# Patient Record
Sex: Female | Born: 1990 | Hispanic: Yes | Marital: Married | Smoking: Never smoker
Health system: Southern US, Community
[De-identification: ages and names within clinical notes are randomized; demographics above are authoritative.]

## PROBLEM LIST (undated history)

## (undated) DIAGNOSIS — O009 Unspecified ectopic pregnancy without intrauterine pregnancy: Secondary | ICD-10-CM

## (undated) DIAGNOSIS — K819 Cholecystitis, unspecified: Secondary | ICD-10-CM

## (undated) DIAGNOSIS — Z789 Other specified health status: Secondary | ICD-10-CM

## (undated) HISTORY — DX: Unspecified ectopic pregnancy without intrauterine pregnancy: O00.90

## (undated) HISTORY — DX: Cholecystitis, unspecified: K81.9

## (undated) HISTORY — PX: CHOLECYSTECTOMY: SHX55

## (undated) HISTORY — PX: SALPINGECTOMY: SHX328

---

## 2018-02-24 DIAGNOSIS — Z8759 Personal history of other complications of pregnancy, childbirth and the puerperium: Secondary | ICD-10-CM | POA: Insufficient documentation

## 2019-06-22 ENCOUNTER — Inpatient Hospital Stay (HOSPITAL_COMMUNITY): Payer: Self-pay

## 2019-06-22 ENCOUNTER — Inpatient Hospital Stay (HOSPITAL_COMMUNITY)
Admission: AD | Admit: 2019-06-22 | Discharge: 2019-06-22 | Disposition: A | Discharge status: Home / Self Care | Payer: Self-pay | Attending: Obstetrics and Gynecology | Admitting: Obstetrics and Gynecology

## 2019-06-22 ENCOUNTER — Other Ambulatory Visit: Payer: Self-pay

## 2019-06-22 ENCOUNTER — Encounter (HOSPITAL_COMMUNITY): Payer: Self-pay | Admitting: Obstetrics and Gynecology

## 2019-06-22 DIAGNOSIS — O3680X0 Pregnancy with inconclusive fetal viability, not applicable or unspecified: Secondary | ICD-10-CM | POA: Insufficient documentation

## 2019-06-22 DIAGNOSIS — O209 Hemorrhage in early pregnancy, unspecified: Secondary | ICD-10-CM | POA: Insufficient documentation

## 2019-06-22 DIAGNOSIS — Z3A09 9 weeks gestation of pregnancy: Secondary | ICD-10-CM | POA: Insufficient documentation

## 2019-06-22 DIAGNOSIS — R109 Unspecified abdominal pain: Secondary | ICD-10-CM | POA: Insufficient documentation

## 2019-06-22 DIAGNOSIS — O26899 Other specified pregnancy related conditions, unspecified trimester: Secondary | ICD-10-CM

## 2019-06-22 DIAGNOSIS — O26891 Other specified pregnancy related conditions, first trimester: Secondary | ICD-10-CM | POA: Insufficient documentation

## 2019-06-22 DIAGNOSIS — O4691 Antepartum hemorrhage, unspecified, first trimester: Secondary | ICD-10-CM

## 2019-06-22 LAB — URINALYSIS, ROUTINE W REFLEX MICROSCOPIC
Bilirubin Urine: NEGATIVE
Glucose, UA: NEGATIVE mg/dL
Ketones, ur: NEGATIVE mg/dL
Nitrite: NEGATIVE
Protein, ur: 100 mg/dL — AB
RBC / HPF: >50 RBC/hpf — ABNORMAL HIGH (ref 0–5)
Specific Gravity, Urine: 1.027 (ref 1.005–1.030)
pH: 5 (ref 5.0–8.0)

## 2019-06-22 LAB — CBC
HCT: 40.5 % (ref 36.0–46.0)
Hemoglobin: 12.8 g/dL (ref 12.0–15.0)
MCH: 28.8 pg (ref 26.0–34.0)
MCHC: 31.6 g/dL (ref 30.0–36.0)
MCV: 91.2 fL (ref 80.0–100.0)
Platelets: 305 10*3/uL (ref 150–400)
RBC: 4.44 MIL/uL (ref 3.87–5.11)
RDW: 13.1 % (ref 11.5–15.5)
WBC: 10.3 10*3/uL (ref 4.0–10.5)
nRBC: 0 % (ref 0.0–0.2)

## 2019-06-22 LAB — WET PREP, GENITAL
Sperm: NONE SEEN
Trich, Wet Prep: NONE SEEN
Yeast Wet Prep HPF POC: NONE SEEN

## 2019-06-22 LAB — ABO/RH: ABO/RH(D): O POS

## 2019-06-22 LAB — POCT PREGNANCY, URINE: Preg Test, Ur: POSITIVE — AB

## 2019-06-22 LAB — HCG, QUANTITATIVE, PREGNANCY: hCG, Beta Chain, Quant, S: 17451 m[IU]/mL — ABNORMAL HIGH (ref <5)

## 2019-06-22 LAB — HIV ANTIBODY (ROUTINE TESTING W REFLEX): HIV Screen 4th Generation wRfx: NONREACTIVE

## 2019-06-22 NOTE — MAU Provider Note (Signed)
History     CSN: [id number]  Arrival date and time: 06/22/19 0347   First Provider Initiated Contact with Patient 06/22/19 [place] # [id number] [place]     Chief Complaint  Patient presents with  . Vaginal Bleeding  . Abdominal Pain   [name] is a 29 y.o. year old G5P1021 female at [redacted]w[redacted]d weeks gestation who presents to MAU reporting positive pregnancy test, vaginal bleeding that started Sunday, Jun 17, 2018 and became heavier on Wednesday, 10/21/2019.  She is reporting spotting with clots and wearing a small pad.  She also complains of lower abdominal pain that "it is very strongly goes away".  She rates it a 5 out of 10. She denies N/V/D.   OB History    Gravida  4   Para  1   Term  1   Preterm      AB  2   Living  1     SAB      TAB  1   Ectopic  1   Multiple      Live Births  1           History reviewed. No pertinent past medical history.  History reviewed. No pertinent surgical history.  History reviewed. No pertinent family history.  Social History   Tobacco Use  . Smoking status: Not on file  Substance Use Topics  . Alcohol use: Not on file  . Drug use: Not on file    Allergies: No Known Allergies  No medications prior to admission.    Review of Systems  Constitutional: Negative.   HENT: Negative.   Eyes: Negative.   Respiratory: Negative.   Cardiovascular: Negative.   Gastrointestinal: Negative.   Endocrine: Negative.   Genitourinary: Positive for pelvic pain (lower abd, "hits me strong & goes away") and vaginal bleeding (heavier since 5/26; "spotting with clots").  Musculoskeletal: Negative.   Skin: Negative.   Allergic/Immunologic: Negative.   Neurological: Negative.   Hematological: Negative.   Psychiatric/Behavioral: Negative.    Physical Exam   Blood pressure (!) 147/79, pulse 89, temperature 98.4 F (36.9 C), temperature source Oral, resp. rate 18, height 5' 2.6" (1.59 m), last menstrual  period 04/15/2019, SpO2 100 %.  Physical Exam  Nursing note and vitals reviewed. Constitutional: She is oriented to person, place, and time. She appears well-developed and well-nourished.  HENT:  Head: Normocephalic and atraumatic.  Eyes: Pupils are equal, round, and reactive to light.  Cardiovascular: Normal rate.  Respiratory: Effort normal.  GI: Soft.  Genitourinary:    Genitourinary Comments: Uterus: non-tender, SE: cervix is smooth, pink, no lesions, small amt of dark, red bloody d/c -- WP, GC/CT done, closed/long/firm, no CMT or friability, no adnexal tenderness    Musculoskeletal:        General: Normal range of motion.     Cervical back: Normal range of motion.  Neurological: She is alert and oriented to person, place, and time.  Skin: Skin is warm and dry.  Psychiatric: She has a normal mood and affect. Her behavior is normal. Judgment and thought content normal.    MAU Course  Procedures  MDM CCUA UPT CBC ABO/Rh HCG Wet Prep GC/CT -- pending HIV -- pending OB < 14 wks [place] with TV  Results for orders placed or performed during the hospital encounter of 06/22/19 (from the past 24 hour(s))  Pregnancy, urine POC     Status: Abnormal   Collection Time: 06/22/19  9:35 AM  Result Value Ref Range   Preg Test, Ur POSITIVE (A) NEGATIVE  Urinalysis, Routine w reflex microscopic     Status: Abnormal   Collection Time: 06/22/19  9:38 AM  Result Value Ref Range   Color, Urine YELLOW YELLOW   APPearance CLOUDY (A) CLEAR   Specific Gravity, Urine 1.027 1.005 - 1.030   pH 5.0 5.0 - 8.0   Glucose, UA NEGATIVE NEGATIVE mg/dL   Hgb urine dipstick LARGE (A) NEGATIVE   Bilirubin Urine NEGATIVE NEGATIVE   Ketones, ur NEGATIVE NEGATIVE mg/dL   Protein, ur 539 (A) NEGATIVE mg/dL   Nitrite NEGATIVE NEGATIVE   Leukocytes,Ua SMALL (A) NEGATIVE   RBC / HPF >50 (H) 0 - 5 RBC/hpf   WBC, UA 21-50 0 - 5 WBC/hpf   Bacteria, UA FEW (A) NONE SEEN   Squamous Epithelial / LPF 6-10 0 - 5    Mucus PRESENT    Ca Oxalate Crys, UA PRESENT   HIV Antibody (routine testing w rflx)     Status: None   Collection Time: 06/22/19 10:52 AM  Result Value Ref Range   HIV Screen 4th Generation wRfx Non Reactive Non Reactive  CBC     Status: None   Collection Time: 06/22/19 10:52 AM  Result Value Ref Range   WBC 10.3 4.0 - 10.5 K/uL   RBC 4.44 3.87 - 5.11 MIL/uL   Hemoglobin 12.8 12.0 - 15.0 g/dL   HCT 76.7 34.1 - 93.7 %   MCV 91.2 80.0 - 100.0 fL   MCH 28.8 26.0 - 34.0 pg   MCHC 31.6 30.0 - 36.0 g/dL   RDW 90.2 40.9 - 73.5 %   Platelets 305 150 - 400 K/uL   nRBC 0.0 0.0 - 0.2 %  ABO/Rh     Status: None   Collection Time: 06/22/19 10:52 AM  Result Value Ref Range   ABO/RH(D) O POS    No rh immune globuloin      NOT A RH IMMUNE GLOBULIN CANDIDATE, PT RH POSITIVE Performed at [facility], [address], [place], [place] [address]   hCG, quantitative, pregnancy     Status: Abnormal   Collection Time: 06/22/19 10:52 AM  Result Value Ref Range   hCG, Beta Chain, Quant, S 17,451 (H) <5 mIU/mL  Wet prep, genital     Status: Abnormal   Collection Time: 06/22/19 11:49 AM   Specimen: Cervix  Result Value Ref Range   Yeast Wet Prep HPF POC NONE SEEN NONE SEEN   Trich, Wet Prep NONE SEEN NONE SEEN   Clue Cells Wet Prep HPF POC PRESENT (A) NONE SEEN   WBC, Wet Prep HPF POC MODERATE (A) NONE SEEN   Sperm NONE SEEN     US OB LESS THAN 14 WEEKS WITH OB TRANSVAGINAL  Result Date: 06/22/2019 CLINICAL DATA:  Vaginal bleeding, abdominal pain EXAM: OBSTETRIC <14 WK ULTRASOUND TECHNIQUE: Transabdominal and transvaginal ultrasound was performed for evaluation of the gestation as well as the maternal uterus and adnexal regions. COMPARISON:  None. FINDINGS: Intrauterine gestational sac: Single Yolk sac:  Equivocal. Embryo:  Equivocal fetal pole measuring no greater than 2 mm. Cardiac Activity: Not Visualized. Heart Rate: Not visualized. MSD:  12.2 mm   6 w   0 d Subchorionic hemorrhage:   None visualized. Maternal uterus/adnexae: Uterus is unremarkable. Adnexa are not well visualized. Not well visualized. IMPRESSION: Single intrauterine gestation with equivocal fetal pole measuring no greater than 2 mm. Sonographic gestational age of [redacted] weeks, 0  days. Early pregnancy of uncertain viability. Recommend follow-up ultrasound in 7 days and serial beta HCG to confirm continued development and viability. Electronically Signed   By: Lauralyn Primes M.D.   On: 06/22/2019 12:53     Assessment and Plan  1) Bleeding in early pregnancy  - Information provided on bleeding in pregnancy   2) Pregnancy with uncertain fetal viability, single or unspecified fetus  - US OB Comp Less 14 Wks in 2 wks - Information provided on miscarriage   3) Abdominal pain affecting pregnancy, antepartum  - Information provided on abd pain in pregnancy  - Discharge patient - assisted by Mattie Marlin spanish Interpreter - Patient verbalized an understanding of the plan of care and agrees.     Raelyn Mora, MSN, CNM 06/22/2019, 10:32 AM

## 2019-06-22 NOTE — MAU Note (Addendum)
PT took pregnancy test and is positive. Started having vaginal bleeding Sunday. It's gotten heavier from Wednesday to today. Is wearing a small pad, not saturating. Describes bleeding as spotting with blood clots.  Has left-sided lower abdominal pain, which "hits me strong and goes away." Rating pain 5/10. Denies nausea, vomiting. No blood seen upon initial assessment. Use  ipad for interpreter.

## 2019-06-22 NOTE — Discharge Instructions (Signed)
Regresar a MAU: *   Si tiene un sangrado ms abundante que empapa ms de 2 toallas sanitarias por hora durante una      hora o ms *   Si sangra tanto que siente que se puede Artist o se desmaya *   Si tiene un dolor abdominal importante que no mejora con Tylenol 1000 mg cada 6 horas segn          sea necesario para el dolor *   Si tiene fiebre> 100.5

## 2019-06-26 ENCOUNTER — Inpatient Hospital Stay (HOSPITAL_COMMUNITY)
Admission: AD | Admit: 2019-06-26 | Discharge: 2019-06-26 | Disposition: A | Discharge status: Home / Self Care | Payer: Self-pay | Attending: Obstetrics & Gynecology | Admitting: Obstetrics & Gynecology

## 2019-06-26 ENCOUNTER — Inpatient Hospital Stay (HOSPITAL_COMMUNITY): Payer: Self-pay

## 2019-06-26 ENCOUNTER — Encounter (HOSPITAL_COMMUNITY): Payer: Self-pay | Admitting: Obstetrics & Gynecology

## 2019-06-26 DIAGNOSIS — O039 Complete or unspecified spontaneous abortion without complication: Secondary | ICD-10-CM | POA: Insufficient documentation

## 2019-06-26 DIAGNOSIS — O209 Hemorrhage in early pregnancy, unspecified: Secondary | ICD-10-CM

## 2019-06-26 DIAGNOSIS — Z3A10 10 weeks gestation of pregnancy: Secondary | ICD-10-CM | POA: Insufficient documentation

## 2019-06-26 HISTORY — DX: Other specified health status: Z78.9

## 2019-06-26 LAB — GC/CHLAMYDIA PROBE AMP (~~LOC~~) NOT AT ARMC
Chlamydia: NEGATIVE
Comment: NEGATIVE
Comment: NORMAL
Neisseria Gonorrhea: NEGATIVE

## 2019-06-26 LAB — HEMOGLOBIN AND HEMATOCRIT, BLOOD
HCT: 38.5 % (ref 36.0–46.0)
Hemoglobin: 12.2 g/dL (ref 12.0–15.0)

## 2019-06-26 LAB — HCG, QUANTITATIVE, PREGNANCY: hCG, Beta Chain, Quant, S: 1982 m[IU]/mL — ABNORMAL HIGH (ref <5)

## 2019-06-26 MED ORDER — OXYCODONE HCL 5 MG PO TABS: 5.0000 mg | ORAL_TABLET | Freq: Four times a day (QID) | ORAL | 0 refills | Status: AC | PRN

## 2019-06-26 MED ORDER — MISOPROSTOL 200 MCG PO TABS
800.0000 ug | ORAL_TABLET | Freq: Once | ORAL | 0 refills | Status: DC
Start: 2019-06-26 — End: 2020-09-07

## 2019-06-26 NOTE — MAU Note (Signed)
Was here on Friday for bleeding. Hormone level was high, but couldn't see the baby, didn't know if aborting or what.  Sat she was bleeding or had pain.  Around 1100 p.m. had a very bad pain, passed a large ball of something.  Only had one episode of strong pain.   Has continued to bleed and have pain.got a call to come and be checked.

## 2019-06-26 NOTE — Discharge Instructions (Signed)
Aborto espontneo Miscarriage El aborto espontneo es la prdida de un beb que no ha nacido (feto) antes de la semana20 del embarazo. La mayor parte de los abortos espontneos ocurre durante los primeros 3meses de embarazo. A veces, un aborto ocurre antes de que la mujer sepa que est embarazada. El aborto espontneo puede ser una experiencia que afecte emocionalmente a la persona. Si ha sufrido un aborto espontneo, hable con su mdico y hgale las preguntas que tenga sobre el aborto espontneo, el proceso de duelo y los planes futuros de embarazo. Cules son las causas? Entre las causas de un aborto espontneo se incluyen las siguientes:  Problemas genticos o cromosmicos del feto. Estos problemas impiden que el beb se desarrolle con normalidad. En general, son el resultado de errores fortuitos que ocurren en la etapa temprana del desarrollo y que no se transmiten de padres a hijos (no se heredan).  Infeccin en el cuello del tero.  Trastornos que afectan el equilibrio hormonal del organismo.  Problemas en el cuello del tero, como su adelgazamiento y apertura antes de que el embarazo llegue a trmino (insuficiencia del cuello de tero).  Problemas en el tero. Estos pueden incluir, entre otros, los siguientes: ? Forma anormal del tero. ? Fibromas en el tero. ? Anormalidades congnitas. Estos son problemas que ya estaban presentes en el nacimiento.  Ciertas enfermedades crnicas.  Fumar, beber alcohol o usar drogas.  Lesiones (traumatismos). En muchos de los casos, se desconoce la causa de los abortos espontneos. Cules son los signos o los sntomas? Los sntomas de esta afeccin incluyen los siguientes:  Sangrado o manchado vaginal, con o sin clicos o dolor.  Dolor o clicos en el abdomen o en la parte inferior de la espalda.  Eliminacin de lquido, tejidos o cogulos sanguneos por la vagina. Cmo se diagnostica? Esta afeccin se puede diagnosticar en funcin de  lo siguiente:  Examen fsico.  Ecografa.  Anlisis de sangre.  Anlisis de orina. Cmo se trata? En algunos casos, el tratamiento de un aborto espontneo no es necesario si se eliminan de forma natural todos los tejidos que se encontraban en el tero. Si fuera necesario realizar un tratamiento por esta afeccin, este puede incluir lo siguiente:  Dilatacin y curetaje (D&C). Mediante este procedimiento, se expande el cuello del tero y se raspan las paredes (endometrio). Esto se realiza solamente si queda tejido del feto o la placenta dentro del cuerpo (aborto espontneo incompleto).  Medicamentos, por ejemplo: ? Antibiticos para tratar una infeccin. ? Medicamentos para ayudar al cuerpo a eliminar los restos de tejido. ? Medicamentos para reducir (contraer) el tamao del tero. Estos medicamentos se pueden administrar si tiene un sangrado abundante. Si su factor sanguneo es Rhnegativo y el de su beb es Rhpositivo, usted necesitar una inyeccin del medicamento llamado inmunoglobulinaRhpara proteger a los bebs futuros de tener problemas con el factorsanguneoRh. Los trminos "Rhnegativo" y "Rhpositivo" hacen referencia a la presencia o no en la sangre de una protena especfica que se encuentra en la superficie de los glbulos rojos (factor Rh). Siga estas indicaciones en su casa: Medicamentos   Tome los medicamentos de venta libre y los recetados solamente como se lo haya indicado el mdico.  Si le recetaron antibiticos, tmelos como se lo haya indicado el mdico. No deje de tomar los antibiticos aunque comience a sentirse mejor.  No tome antiinflamatorios no esteroideos (AINE), tales como aspirina e ibuprofeno, a menos que se lo indique el mdico. Estos medicamentos pueden provocarle sangrado. Actividad  Haga   reposo segn lo indicado. Pregntele al mdico qu actividades son seguras para usted.  Pdale a alguien que la ayude con las responsabilidades familiares y del  hogar durante este tiempo. Instrucciones generales  Lleve un registro de la cantidad y la saturacin de las toallas higinicas que Medical laboratory scientific officer. Anote esta informacin.  Anote la cantidad de tejido o cogulos sanguneos que expulsa por la vagina. Guarde las cantidades grandes de tejidos para que el [employer] examine.  No use tampones, no se haga duchas vaginales ni tenga relaciones sexuales hasta que el mdico la autorice.  Para que usted y su pareja puedan sobrellevar el proceso del duelo, hable con su mdico o busque apoyo psicolgico.  Cuando est lista, visite a su mdico para hablar sobre los pasos importantes que deber seguir en relacin con su salud. Tambin hable [employer] que deber tomar para tener un embarazo saludable en el futuro.  Concurra a todas las visitas de seguimiento como se lo haya indicado el mdico. Esto es importante. Dnde encontrar ms informacin  [employer] [place] y Careers information officer of Obstetricians and Gynecologists): [email or web address] [place], [place] (U.S. Department of Health and [employer], Office on [employer]): [email or web address] [employer] con un mdico si:  Tiene fiebre o siente escalofros.  Tiene una secrecin vaginal con mal olor.  El sangrado aumenta en vez de disminuir. Solicite ayuda de inmediato si:  Siente calambres intensos o dolor en la espalda o en el abdomen.  Elimina cogulos de sangre o tejido por la vagina del tamao de una nuez o ms grandes.  Necesita ms de una toalla higinica de tamao regular por hora.  Se siente mareada o dbil.  Se desmaya.  Siente una tristeza que la invade o Interior and spatial designer. Resumen  La mayor parte de los abortos espontneos ocurre durante los primeros 85meses de Media planner. En algunos casos, el aborto espontneo ocurre antes de que la mujer sepa que est  [place].  Siga las indicaciones del mdico para el cuidado Financial planner. Concurra a todas las visitas de control.  Para que usted y su pareja puedan sobrellevar el proceso del duelo, hable con su mdico o busque apoyo psicolgico. Esta informacin no tiene Marine scientist el consejo del mdico. Asegrese de hacerle al mdico cualquier pregunta que tenga. Document Revised: 10/18/2016 Document Reviewed: 10/18/2016 Elsevier Patient Education  [place] anticonceptivo Contraception Choices La anticoncepcin, o los mtodos anticonceptivos, hace referencia a los mtodos o dispositivos que evitan el [place]. Mtodos hormonales Implante anticonceptivo  Un implante anticonceptivo consiste en un tubo plstico delgado que contiene una hormona. Se inserta en la parte superior del brazo. Puede Technical brewer por 3 aos. Inyecciones de progestina sola Las inyecciones de progestina sola contienen progestina, una forma sinttica de la hormona progesterona. Un mdico las administra cada 3 meses. Pldoras anticonceptivas  Las pldoras anticonceptivas son pastillas que contienen hormonas que evitan el [place]. Deben tomarse una vez al da, preferentemente a [place]. Parches anticonceptivos  El parche anticonceptivo contiene hormonas que evitan el [place]. Se coloca en la piel, debe cambiarse una vez a la semana durante tres semanas y debe retirarse en la cuarta semana. Se necesita una receta para utilizar este mtodo anticonceptivo. Anillo vaginal  Un anillo vaginal contiene hormonas que evitan el embarazo. Se coloca en la vagina durante tres semanas y  se retira en la cuarta semana. Luego se repite el proceso con un anillo nuevo. Se necesita una receta para utilizar este mtodo anticonceptivo. Anticonceptivo de emergencia Los anticonceptivos de emergencia son mtodos para evitar un embarazo despus de Warehouse manager sexo sin proteccin. Vienen en  forma de pldora y pueden tomarse hasta 5 das despus de [place]. Funcionan mejor cuando se toman lo ms pronto posible luego de [employer]. La mayora de los anticonceptivos de emergencia estn disponibles sin receta mdica. Este mtodo no debe utilizarse como el nico mtodo anticonceptivo. Mtodos de barrera Preservativo masculino  Un preservativo masculino es una vaina delgada que se coloca sobre el pene durante el sexo. Los preservativos evitan que el esperma ingrese en el cuerpo de la [place]. Pueden utilizarse con un espermicida para aumentar la efectividad. Deben desecharse luego de su uso. Preservativo femenino  Un preservativo femenino es una vaina blanda y holgada que se coloca en la vagina antes de Nidya Bouyer. El preservativo evita que el esperma ingrese en el cuerpo de la [place]. Deben desecharse luego de su uso. Diafragma  Un diafragma es una barrera blanda con forma de cpula. Se inserta en la vagina antes del sexo, junto con un espermicida. El diafragma bloquea el ingreso de esperma en el tero, y el espermicida mata a los espermatozoides. El Designer, fashion/clothing en la vagina durante 6 a 8 horas despus de Warehouse manager sexo y debe retirarse en el plazo de las 24 horas. Un diafragma es recetado y colocado por un mdico. Debe reemplazarse cada 1 a 2 aos, despus de dar a luz, de aumentar ms de 15lb (6,8kg) y de [place] plvica. Capuchn cervical  Un capuchn cervical es una copa redonda y blanda de ltex o plstico que se coloca en el cuello uterino. Se inserta en la vagina antes del sexo, junto con un espermicida. Bloquea el ingreso del esperma en el tero. El capuchn Radio producer durante 6 a 8 horas despus de Warehouse manager sexo y debe retirarse en el plazo de las 48 horas. Un capuchn cervical debe ser recetado y colocado por un mdico. Debe reemplazarse cada 2aos. Esponja  Una esponja es una pieza blanda y circular de espuma de poliuretano que contiene espermicida.  La esponja ayuda a bloquear el ingreso de esperma en el tero, y el espermicida mata a los espermatozoides. Belva Bertin, debe humedecerla e insertarla en la vagina. Debe insertarse antes de [employer], debe permanecer dentro al menos durante 6 horas despus de tener sexo y debe retirarse y Nurse, adult en el plazo de las 30 horas. Espermicidas Los espermicidas son sustancias qumicas que matan o bloquean al esperma y no lo dejan ingresar al cuello uterino y al tero. Vienen en forma de crema, gel, supositorio, espuma o comprimido. Un espermicida debe insertarse en la vagina con un aplicador al menos 10 o 15 minutos antes de tener sexo para dar tiempo a que surta [place]. El proceso debe repetirse cada vez que tenga sexo. Los espermicidas no requieren Emergency planning/management officer. Anticonceptivos intrauterinos Dispositivo intrauterino (DIU). Un DIU es un dispositivo en forma de T que se coloca en el tero. Existen dos tipos:  DIU hormonal.Este tipo contiene progestina, una forma sinttica de la hormona progesterona. Este tipo puede permanecer colocado durante 3 a 5 aos.  DIU de cobre.Este tipo est recubierto con un alambre de cobre. Puede permanecer colocado durante 10 aos.  Mtodos anticonceptivos permanentes Ligadura de trompas en la mujer En este mtodo, se sellan, atan u  obstruyen las trompas de [place] durante una ciruga para Automotive engineer que el vulo descienda hacia el tero. Esterilizacin histeroscpica En este mtodo, se coloca un implante pequeo y flexible dentro de cada trompa de [place]opio. Los implantes hacen que se forme un tejido cicatricial en las trompas de [place]op[place] y que las obstruya para que el espermatozoide no pueda llegar al vulo. El procedimiento demora alrededor de 3 meses para que sea [place]. Debe utilizarse otro mtodo anticonceptivo durante esos 3 meses. Esterilizacin masculina Este es un procedimiento que consiste en atar los conductos que transportan el esperma (vasectoma). Luego del  procedimiento, el hombre Manufacturing engineer lquido (semen). Mtodos de planificacin natural Planificacin familiar natural En este mtodo, la pareja no tiene [employer] la mujer podra quedar [place]. Mtodo calendario Marketing executive un seguimiento de la duracin de cada ciclo menstrual, identificar los [employer] que se puede producir un Psychiatrist y no Warehouse manager sexo durante esos [place]. Mtodo de la ovulacin En este mtodo, la pareja evita tener sexo durante la ovulacin. Mtodo sintotrmico Este mtodo implica no tener sexo durante la ovulacin. Normalmente, la mujer comprueba la ovulacin al observar cambios en su temperatura y en la consistencia del moco cervical. Mtodo posovulacin En este mtodo, la pareja espera a que finalice la ovulacin para Doctor, hospital. Resumen  La anticoncepcin, o los mtodos anticonceptivos, hace referencia a los mtodos o dispositivos que evitan el [place].  Los mtodos anticonceptivos hormonales incluyen implantes, inyecciones, pastillas, parches, anillos vaginales y anticonceptivos de Associate Professor.  Los mtodos anticonceptivos de barrera pueden incluir preservativos masculinos, preservativos femeninos, diafragmas, capuchones cervicales, esponjas y espermicidas.  [employer] tipos de DIU (dispositivo intrauterino). Un DIU puede colocarse en el tero de una mujer para evitar el embarazo durante 3 a 5 aos.  La esterilizacin permanente puede realizarse mediante un procedimiento para hombres, mujeres o ambos.  Los [employer] de Medical sales representative natural incluyen no tener [employer] la mujer podra quedar [place]. Esta informacin no tiene Theme park manager el consejo del mdico. Asegrese de hacerle al mdico cualquier pregunta que tenga. Document Revised: 02/12/2017 Document Reviewed: 05/03/2016 Elsevier Patient Education  2020 [employer].

## 2019-06-26 NOTE — MAU Provider Note (Signed)
Chief Complaint: Vaginal Bleeding   First Provider Initiated Contact with Patient 06/26/19 1444     *Spanish interpreter at bedside for this encounter*  SUBJECTIVE HPI: Cheryl Nelson is a 29 y.o. G4P1021 at [redacted]w[redacted]d who presents to Maternity Admissions reporting vaginal bleeding. Was seen in MAU on 5/28 for the same. Diagnosed with threatened miscarriage.  States bleeding increased over the weekend until she passed a large blood clot on Saturday evening. Bleeding has decreased gradually since then. No longer passing clots & not filling up pads. Reports occasional sporadic abdominal cramping. No other symptoms.   Location: abdomen Quality: cramping Severity: 5/10 on pain scale Duration: 4 days Timing: intermittent Modifying factors: none Associated signs and symptoms: vaginal bleeding  Past Medical History:  Diagnosis Date  . Medical history non-contributory    OB History  Gravida Para Term Preterm AB Living  4 1 1   2 1   SAB TAB Ectopic Multiple Live Births    1 1   1     # Outcome Date GA Lbr Len/2nd Weight Sex Delivery Anes PTL Lv  4 Current           3 TAB 05/27/18          2 Ectopic 2019          1 Term 05/15/08    F Vag-Spont   LIV   Past Surgical History:  Procedure Laterality Date  . CHOLECYSTECTOMY    . SALPINGECTOMY Left    Social History   Socioeconomic History  . Marital status: Single    Spouse name: Not on file  . Number of children: Not on file  . Years of education: Not on file  . Highest education level: Not on file  Occupational History  . Not on file  Tobacco Use  . Smoking status: Never Smoker  . Smokeless tobacco: Never Used  Substance and Sexual Activity  . Alcohol use: Never  . Drug use: Never  . Sexual activity: Yes  Other Topics Concern  . Not on file  Social History Narrative  . Not on file   Social Determinants of Health   Financial Resource Strain:   . Difficulty of Paying Living Expenses:   Food Insecurity:   . Worried  About 2020 in the Last Year:   . 05/17/08 in the Last Year:   Transportation Needs:   . Programme researcher, broadcasting/film/video (Medical):   Barista Lack of Transportation (Non-Medical):   Physical Activity:   . Days of Exercise per Week:   . Minutes of Exercise per Session:   Stress:   . Feeling of Stress :   Social Connections:   . Frequency of Communication with Friends and Family:   . Frequency of Social Gatherings with Friends and Family:   . Attends Religious Services:   . Active Member of Clubs or Organizations:   . Attends Freight forwarder Meetings:   Marland Kitchen Marital Status:   Intimate Partner Violence:   . Fear of Current or Ex-Partner:   . Emotionally Abused:   Banker Physically Abused:   . Sexually Abused:    Family History  Problem Relation Age of Onset  . Healthy Mother   . Healthy Father    No current facility-administered medications on file prior to encounter.   Current Outpatient Medications on File Prior to Encounter  Medication Sig Dispense Refill  . Prenatal Vit-Fe Fumarate-FA (PRENATAL MULTIVITAMIN) TABS tablet Take 1 tablet by mouth daily at 12  noon.     No Known Allergies  I have reviewed patient's Past Medical Hx, Surgical Hx, Family Hx, Social Hx, medications and allergies.   Review of Systems  Constitutional: Negative.   Gastrointestinal: Positive for abdominal pain. Negative for diarrhea, nausea and vomiting.  Genitourinary: Positive for vaginal bleeding.    OBJECTIVE Patient Vitals for the past 24 hrs:  BP Temp Temp src Pulse Resp SpO2 Height Weight  06/26/19 1334 122/70 98.1 F (36.7 C) Oral 74 18 100 % 5' 1.42" (1.56 m) 106.9 kg   Constitutional: Well-developed, well-nourished female in no acute distress.  Cardiovascular: normal rate & rhythm, no murmur Respiratory: normal rate and effort. Lung sounds clear throughout GI: Abd soft, non-tender, Pos BS x 4. No guarding or rebound tenderness MS: Extremities nontender, no edema, normal  ROM Neurologic: Alert and oriented x 4.   LAB RESULTS Results for orders placed or performed during the hospital encounter of 06/26/19 (from the past 24 hour(s))  Hemoglobin and hematocrit, blood     Status: None   Collection Time: 06/26/19  2:32 PM  Result Value Ref Range   Hemoglobin 12.2 12.0 - 15.0 g/dL   HCT 38.5 36.0 - 46.0 %  hCG, quantitative, pregnancy     Status: Abnormal   Collection Time: 06/26/19  2:32 PM  Result Value Ref Range   hCG, Beta Chain, Quant, S 1,982 (H) <5 mIU/mL    IMAGING US OB Transvaginal  Result Date: 06/26/2019 CLINICAL DATA:  Vaginal bleeding in a patient with a positive pregnancy test. EXAM: TRANSVAGINAL OB ULTRASOUND TECHNIQUE: Transvaginal ultrasound was performed for complete evaluation of the gestation as well as the maternal uterus, adnexal regions, and pelvic cul-de-sac. COMPARISON:  Early OB ultrasound 06/22/2019. FINDINGS: Intrauterine gestational sac: Not visualized. The endometrium is somewhat heterogeneous with a thickness of 16 mm and a small focus of blood flow on Doppler imaging. 1.4 cm posterior uterine fibroid is noted. Yolk sac:  Not visualized. Embryo:  Not visualized. Cardiac Activity: Not applicable. Subchorionic hemorrhage:  None visualized. Maternal uterus/adnexae: Negative. IMPRESSION: Previously seen intrauterine gestational sac is no longer present. The endometrium measures 16 mm and is heterogeneous with a small focus of blood flow possibly due to retained products of conception. Electronically Signed   By: Inge Rise M.D.   On: 06/26/2019 15:45    MAU COURSE Orders Placed This Encounter  Procedures  . US OB Transvaginal  . Hemoglobin and hematocrit, blood  . hCG, quantitative, pregnancy  . Discharge patient   Meds ordered this encounter  Medications  . misoprostol (CYTOTEC) 200 MCG tablet    Sig: Take 4 tablets (800 mcg total) by mouth once for 1 dose.    Dispense:  4 tablet    Refill:  0    Order Specific Question:    Supervising Provider    Answer:   Woodroe Mode [[id number]]  . oxyCODONE (ROXICODONE) 5 MG immediate release tablet    Sig: Take 1 tablet (5 mg total) by mouth every 6 (six) hours as needed for up to 3 days for breakthrough pain.    Dispense:  12 tablet    Refill:  0    Order Specific Question:   Supervising Provider    Answer:   Woodroe Mode [[id number]]    MDM RH positive  HCG down to 1,982 from 17,451 & gestational sac no longer seen on ultrasound. Discussed results indicated miscarriage (with Spanish interpreter). Slightly thickened & heterogenous endometrium (16 mm) - will send  home with dose of cytotec.   ASSESSMENT 1. Miscarriage   2. Vaginal bleeding in pregnancy, first trimester     PLAN Discharge home in stable condition. Bleeding/infection precautions  Rx cytotec & oxycodone Msg to [facility] for miscarriage follow up. Follow-up Information    [facility] Follow up.   Specialty: Obstetrics and Gynecology Contact information: [address] [place] [address] [phone]         Allergies as of 06/26/2019   No Known Allergies     Medication List    TAKE these medications   misoprostol 200 MCG tablet Commonly known as: CYTOTEC Take 4 tablets (800 mcg total) by mouth once for 1 dose.   oxyCODONE 5 MG immediate release tablet Commonly known as: Roxicodone Take 1 tablet (5 mg total) by mouth every 6 (six) hours as needed for up to 3 days for breakthrough pain.   prenatal multivitamin Tabs tablet Take 1 tablet by mouth daily at 12 noon.        Judeth Horn, NP 06/26/2019  4:56 PM

## 2019-07-18 ENCOUNTER — Ambulatory Visit: Payer: Self-pay | Admitting: Obstetrics and Gynecology

## 2019-07-18 ENCOUNTER — Encounter: Payer: Self-pay | Admitting: Family Medicine

## 2020-08-21 LAB — HEPATITIS C ANTIBODY: HCV Ab: NEGATIVE

## 2020-08-21 LAB — OB RESULTS CONSOLE RPR: RPR: NONREACTIVE

## 2020-08-21 LAB — OB RESULTS CONSOLE RUBELLA ANTIBODY, IGM: Rubella: IMMUNE

## 2020-08-21 LAB — OB RESULTS CONSOLE HEPATITIS B SURFACE ANTIGEN: Hepatitis B Surface Ag: NEGATIVE

## 2020-09-03 ENCOUNTER — Other Ambulatory Visit: Payer: Self-pay

## 2020-09-03 ENCOUNTER — Other Ambulatory Visit: Payer: Self-pay | Admitting: Obstetrics and Gynecology

## 2020-09-03 ENCOUNTER — Ambulatory Visit
Admission: RE | Admit: 2020-09-03 | Discharge: 2020-09-03 | Disposition: A | Discharge status: Home / Self Care | Payer: Self-pay | Source: Ambulatory Visit | Attending: Obstetrics and Gynecology | Admitting: Obstetrics and Gynecology

## 2020-09-03 DIAGNOSIS — Z111 Encounter for screening for respiratory tuberculosis: Secondary | ICD-10-CM

## 2020-09-07 ENCOUNTER — Inpatient Hospital Stay (HOSPITAL_BASED_OUTPATIENT_CLINIC_OR_DEPARTMENT_OTHER): Payer: Self-pay

## 2020-09-07 ENCOUNTER — Observation Stay (HOSPITAL_COMMUNITY)
Admission: EM | Admit: 2020-09-07 | Discharge: 2020-09-08 | Disposition: A | Discharge status: Home / Self Care | Payer: Self-pay | Attending: Family Medicine | Admitting: Family Medicine

## 2020-09-07 ENCOUNTER — Other Ambulatory Visit: Payer: Self-pay

## 2020-09-07 ENCOUNTER — Encounter (HOSPITAL_COMMUNITY): Payer: Self-pay

## 2020-09-07 DIAGNOSIS — O4592 Premature separation of placenta, unspecified, second trimester: Secondary | ICD-10-CM

## 2020-09-07 DIAGNOSIS — O99891 Other specified diseases and conditions complicating pregnancy: Secondary | ICD-10-CM

## 2020-09-07 DIAGNOSIS — Z20822 Contact with and (suspected) exposure to covid-19: Secondary | ICD-10-CM | POA: Insufficient documentation

## 2020-09-07 DIAGNOSIS — O4692 Antepartum hemorrhage, unspecified, second trimester: Secondary | ICD-10-CM

## 2020-09-07 DIAGNOSIS — O208 Other hemorrhage in early pregnancy: Secondary | ICD-10-CM | POA: Insufficient documentation

## 2020-09-07 DIAGNOSIS — Z3A15 15 weeks gestation of pregnancy: Secondary | ICD-10-CM

## 2020-09-07 DIAGNOSIS — O9A212 Injury, poisoning and certain other consequences of external causes complicating pregnancy, second trimester: Principal | ICD-10-CM | POA: Insufficient documentation

## 2020-09-07 LAB — I-STAT BETA HCG BLOOD, ED (MC, WL, AP ONLY): I-stat hCG, quantitative: >2000 m[IU]/mL — ABNORMAL HIGH (ref <5)

## 2020-09-07 LAB — COMPREHENSIVE METABOLIC PANEL
ALT: 28 U/L (ref 0–44)
AST: 26 U/L (ref 15–41)
Albumin: 3.2 g/dL — ABNORMAL LOW (ref 3.5–5.0)
Alkaline Phosphatase: 60 U/L (ref 38–126)
Anion gap: 7 (ref 5–15)
BUN: 5 mg/dL — ABNORMAL LOW (ref 6–20)
CO2: 21 mmol/L — ABNORMAL LOW (ref 22–32)
Calcium: 8.9 mg/dL (ref 8.9–10.3)
Chloride: 109 mmol/L (ref 98–111)
Creatinine, Ser: 0.46 mg/dL (ref 0.44–1.00)
GFR, Estimated: >60 mL/min (ref >60)
Glucose, Bld: 94 mg/dL (ref 70–99)
Potassium: 3.8 mmol/L (ref 3.5–5.1)
Sodium: 137 mmol/L (ref 135–145)
Total Bilirubin: 0.2 mg/dL — ABNORMAL LOW (ref 0.3–1.2)
Total Protein: 6.2 g/dL — ABNORMAL LOW (ref 6.5–8.1)

## 2020-09-07 LAB — CBC
HCT: 36.1 % (ref 36.0–46.0)
HCT: 32.8 % — ABNORMAL LOW (ref 36.0–46.0)
Hemoglobin: 12 g/dL (ref 12.0–15.0)
Hemoglobin: 10.6 g/dL — ABNORMAL LOW (ref 12.0–15.0)
MCH: 29.9 pg (ref 26.0–34.0)
MCH: 29.3 pg (ref 26.0–34.0)
MCHC: 33.2 g/dL (ref 30.0–36.0)
MCHC: 32.3 g/dL (ref 30.0–36.0)
MCV: 90 fL (ref 80.0–100.0)
MCV: 90.6 fL (ref 80.0–100.0)
Platelets: 279 10*3/uL (ref 150–400)
Platelets: 267 10*3/uL (ref 150–400)
RBC: 4.01 MIL/uL (ref 3.87–5.11)
RBC: 3.62 MIL/uL — ABNORMAL LOW (ref 3.87–5.11)
RDW: 13.6 % (ref 11.5–15.5)
RDW: 13.7 % (ref 11.5–15.5)
WBC: 19.9 10*3/uL — ABNORMAL HIGH (ref 4.0–10.5)
WBC: 17.3 10*3/uL — ABNORMAL HIGH (ref 4.0–10.5)
nRBC: 0 % (ref 0.0–0.2)
nRBC: 0 % (ref 0.0–0.2)

## 2020-09-07 LAB — FIBRINOGEN: Fibrinogen: 407 mg/dL (ref 210–475)

## 2020-09-07 LAB — TYPE AND SCREEN
ABO/RH(D): O POS
Antibody Screen: NEGATIVE

## 2020-09-07 LAB — PROTIME-INR
INR: 1 (ref 0.8–1.2)
Prothrombin Time: 13.3 seconds (ref 11.4–15.2)

## 2020-09-07 LAB — RESP PANEL BY RT-PCR (FLU A&B, COVID) ARPGX2
Influenza A by PCR: NEGATIVE
Influenza B by PCR: NEGATIVE
SARS Coronavirus 2 by RT PCR: NEGATIVE

## 2020-09-07 LAB — OB RESULTS CONSOLE ABO/RH: RH Type: POSITIVE

## 2020-09-07 LAB — APTT: aPTT: 29 seconds (ref 24–36)

## 2020-09-07 MED ORDER — ACETAMINOPHEN 325 MG PO TABS
650.0000 mg | ORAL_TABLET | ORAL | Status: DC | PRN
  Administered 2020-09-07 – 2020-09-08 (×3): 650 mg via ORAL
  Filled 2020-09-07 (×3): qty 2

## 2020-09-07 MED ORDER — SODIUM CHLORIDE 0.9% FLUSH: 3.0000 mL | Freq: Two times a day (BID) | INTRAVENOUS | Status: DC

## 2020-09-07 MED ORDER — SIMETHICONE 80 MG PO CHEW
80.0000 mg | CHEWABLE_TABLET | Freq: Four times a day (QID) | ORAL | Status: DC | PRN
  Administered 2020-09-07: 80 mg via ORAL
  Filled 2020-09-07: qty 1

## 2020-09-07 MED ORDER — ZOLPIDEM TARTRATE 5 MG PO TABS: 5.0000 mg | ORAL_TABLET | Freq: Every evening | ORAL | Status: DC | PRN

## 2020-09-07 MED ORDER — PRENATAL MULTIVITAMIN CH
1.0000 | ORAL_TABLET | Freq: Every day | ORAL | Status: DC
  Administered 2020-09-07: 1 via ORAL
  Filled 2020-09-07: qty 1

## 2020-09-07 MED ORDER — DOCUSATE SODIUM 100 MG PO CAPS
100.0000 mg | ORAL_CAPSULE | Freq: Every day | ORAL | Status: DC
  Administered 2020-09-07 – 2020-09-08 (×2): 100 mg via ORAL
  Filled 2020-09-07 (×2): qty 1

## 2020-09-07 MED ORDER — CALCIUM CARBONATE ANTACID 500 MG PO CHEW: 2.0000 | CHEWABLE_TABLET | ORAL | Status: DC | PRN

## 2020-09-07 MED ORDER — SODIUM CHLORIDE 0.9 % IV SOLN: 250.0000 mL | INTRAVENOUS | Status: DC | PRN

## 2020-09-07 MED ORDER — SODIUM CHLORIDE 0.9% FLUSH: 3.0000 mL | INTRAVENOUS | Status: DC | PRN
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
## 2020-09-08 ENCOUNTER — Encounter (HOSPITAL_COMMUNITY): Payer: Self-pay | Admitting: Family Medicine

## 2020-09-08 LAB — CBC
HCT: 31.6 % — ABNORMAL LOW (ref 36.0–46.0)
Hemoglobin: 10.2 g/dL — ABNORMAL LOW (ref 12.0–15.0)
MCH: 29.4 pg (ref 26.0–34.0)
MCHC: 32.3 g/dL (ref 30.0–36.0)
MCV: 91.1 fL (ref 80.0–100.0)
Platelets: 239 10*3/uL (ref 150–400)
RBC: 3.47 MIL/uL — ABNORMAL LOW (ref 3.87–5.11)
RDW: 13.8 % (ref 11.5–15.5)
WBC: 14.2 10*3/uL — ABNORMAL HIGH (ref 4.0–10.5)
nRBC: 0 % (ref 0.0–0.2)

## 2020-09-08 LAB — KLEIHAUER-BETKE STAIN
Fetal Cells %: 0 %
Quantitation Fetal Hemoglobin: 0 mL

## 2020-09-08 MED ORDER — DOCUSATE SODIUM 100 MG PO CAPS: 100.0000 mg | ORAL_CAPSULE | Freq: Two times a day (BID) | ORAL | 2 refills | Status: DC | PRN
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
## 2020-09-18 ENCOUNTER — Other Ambulatory Visit: Payer: Self-pay | Admitting: Nurse Practitioner

## 2020-09-18 DIAGNOSIS — O09629 Supervision of young multigravida, unspecified trimester: Secondary | ICD-10-CM

## 2020-10-10 ENCOUNTER — Ambulatory Visit: Payer: Self-pay | Attending: Nurse Practitioner

## 2020-10-10 ENCOUNTER — Other Ambulatory Visit: Payer: Self-pay | Admitting: *Deleted

## 2020-10-10 ENCOUNTER — Ambulatory Visit: Payer: Self-pay | Attending: Maternal & Fetal Medicine | Admitting: Maternal & Fetal Medicine

## 2020-10-10 ENCOUNTER — Ambulatory Visit: Payer: Self-pay | Admitting: *Deleted

## 2020-10-10 ENCOUNTER — Encounter: Payer: Self-pay | Admitting: *Deleted

## 2020-10-10 ENCOUNTER — Other Ambulatory Visit: Payer: Self-pay

## 2020-10-10 ENCOUNTER — Other Ambulatory Visit: Payer: Self-pay | Admitting: Nurse Practitioner

## 2020-10-10 DIAGNOSIS — O9A212 Injury, poisoning and certain other consequences of external causes complicating pregnancy, second trimester: Secondary | ICD-10-CM | POA: Insufficient documentation

## 2020-10-10 DIAGNOSIS — O418X20 Other specified disorders of amniotic fluid and membranes, second trimester, not applicable or unspecified: Secondary | ICD-10-CM

## 2020-10-10 DIAGNOSIS — O468X9 Other antepartum hemorrhage, unspecified trimester: Secondary | ICD-10-CM

## 2020-10-10 DIAGNOSIS — O09629 Supervision of young multigravida, unspecified trimester: Secondary | ICD-10-CM

## 2020-10-10 DIAGNOSIS — O09622 Supervision of young multigravida, second trimester: Secondary | ICD-10-CM | POA: Insufficient documentation

## 2020-10-10 DIAGNOSIS — Z363 Encounter for antenatal screening for malformations: Secondary | ICD-10-CM | POA: Insufficient documentation

## 2020-10-10 DIAGNOSIS — Z041 Encounter for examination and observation following transport accident: Secondary | ICD-10-CM | POA: Insufficient documentation

## 2020-10-10 DIAGNOSIS — O418X90 Other specified disorders of amniotic fluid and membranes, unspecified trimester, not applicable or unspecified: Secondary | ICD-10-CM

## 2020-10-10 DIAGNOSIS — Z3A20 20 weeks gestation of pregnancy: Secondary | ICD-10-CM | POA: Insufficient documentation

## 2020-10-10 DIAGNOSIS — O468X2 Other antepartum hemorrhage, second trimester: Secondary | ICD-10-CM
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
## 2020-11-07 ENCOUNTER — Ambulatory Visit: Payer: Self-pay | Admitting: *Deleted

## 2020-11-07 ENCOUNTER — Encounter: Payer: Self-pay | Admitting: *Deleted

## 2020-11-07 ENCOUNTER — Ambulatory Visit: Payer: Self-pay | Attending: Maternal & Fetal Medicine

## 2020-11-07 ENCOUNTER — Other Ambulatory Visit: Payer: Self-pay

## 2020-11-07 VITALS — BP 112/70 | HR 84

## 2020-11-07 DIAGNOSIS — Z6837 Body mass index (BMI) 37.0-37.9, adult: Secondary | ICD-10-CM

## 2020-11-07 DIAGNOSIS — E669 Obesity, unspecified: Secondary | ICD-10-CM

## 2020-11-07 DIAGNOSIS — O468X2 Other antepartum hemorrhage, second trimester: Secondary | ICD-10-CM

## 2020-11-07 DIAGNOSIS — O9A212 Injury, poisoning and certain other consequences of external causes complicating pregnancy, second trimester: Secondary | ICD-10-CM

## 2020-11-07 DIAGNOSIS — O468X9 Other antepartum hemorrhage, unspecified trimester: Secondary | ICD-10-CM | POA: Insufficient documentation

## 2020-11-07 DIAGNOSIS — O418X90 Other specified disorders of amniotic fluid and membranes, unspecified trimester, not applicable or unspecified: Secondary | ICD-10-CM | POA: Insufficient documentation

## 2020-11-07 DIAGNOSIS — O99212 Obesity complicating pregnancy, second trimester: Secondary | ICD-10-CM

## 2020-11-07 DIAGNOSIS — O418X20 Other specified disorders of amniotic fluid and membranes, second trimester, not applicable or unspecified: Secondary | ICD-10-CM

## 2020-11-07 DIAGNOSIS — Z3A24 24 weeks gestation of pregnancy: Secondary | ICD-10-CM

## 2020-11-10 ENCOUNTER — Other Ambulatory Visit: Payer: Self-pay | Admitting: *Deleted

## 2020-11-10 DIAGNOSIS — Z6841 Body Mass Index (BMI) 40.0 and over, adult: Secondary | ICD-10-CM

## 2020-12-04 LAB — OB RESULTS CONSOLE HIV ANTIBODY (ROUTINE TESTING): HIV: NONREACTIVE

## 2020-12-12 ENCOUNTER — Ambulatory Visit: Payer: Self-pay | Attending: Maternal & Fetal Medicine

## 2020-12-12 ENCOUNTER — Other Ambulatory Visit: Payer: Self-pay

## 2020-12-12 ENCOUNTER — Encounter: Payer: Self-pay | Admitting: *Deleted

## 2020-12-12 ENCOUNTER — Ambulatory Visit: Payer: Self-pay | Admitting: *Deleted

## 2020-12-12 DIAGNOSIS — O99213 Obesity complicating pregnancy, third trimester: Secondary | ICD-10-CM

## 2020-12-12 DIAGNOSIS — O418X90 Other specified disorders of amniotic fluid and membranes, unspecified trimester, not applicable or unspecified: Secondary | ICD-10-CM | POA: Insufficient documentation

## 2020-12-12 DIAGNOSIS — O468X9 Other antepartum hemorrhage, unspecified trimester: Secondary | ICD-10-CM

## 2020-12-12 DIAGNOSIS — E669 Obesity, unspecified: Secondary | ICD-10-CM

## 2020-12-12 DIAGNOSIS — O4593 Premature separation of placenta, unspecified, third trimester: Secondary | ICD-10-CM

## 2020-12-12 DIAGNOSIS — Z3A29 29 weeks gestation of pregnancy: Secondary | ICD-10-CM

## 2021-01-02 ENCOUNTER — Other Ambulatory Visit: Payer: Self-pay

## 2021-01-02 ENCOUNTER — Ambulatory Visit: Payer: Self-pay | Admitting: *Deleted

## 2021-01-02 ENCOUNTER — Ambulatory Visit: Payer: Self-pay | Attending: Obstetrics

## 2021-01-02 ENCOUNTER — Encounter: Payer: Self-pay | Admitting: *Deleted

## 2021-01-02 VITALS — BP 121/71 | HR 114

## 2021-01-02 DIAGNOSIS — O99213 Obesity complicating pregnancy, third trimester: Secondary | ICD-10-CM

## 2021-01-02 DIAGNOSIS — Z3A32 32 weeks gestation of pregnancy: Secondary | ICD-10-CM

## 2021-01-02 DIAGNOSIS — E669 Obesity, unspecified: Secondary | ICD-10-CM

## 2021-01-02 DIAGNOSIS — Z6841 Body Mass Index (BMI) 40.0 and over, adult: Secondary | ICD-10-CM | POA: Insufficient documentation

## 2021-01-02 DIAGNOSIS — O9A213 Injury, poisoning and certain other consequences of external causes complicating pregnancy, third trimester: Secondary | ICD-10-CM

## 2021-01-25 NOTE — L&D Delivery Note (Addendum)
°  Delivery Note Called to room and patient was complete and pushing, nurse delivery. No nuchal cord present. Shoulder and body delivered in usual fashion at 0240 a viable female (female/female) was delivered via Vaginal, Spontaneous (Presentation: LOA).  Infant with spontaneous cry, placed on mother's abdomen, dried and stimulated. Cord clamped x 2 after 1-minute delay, and cut by father. Cord blood drawn. Placenta delivered spontaneously with gentle cord traction. Appears intact. Fundus firm with massage and Pitocin. Labia, perineum, vagina, and cervix inspected with 4x4 gauze.    APGAR:9 ,9 ; weight: pending, see delivery summary.   Cord: 3VC with the following complications: None  Anesthesia: N/A  Episiotomy: None Lacerations: 1st degree perineal Suture Repair: 3.0 vicryl rapide Est. Blood Loss (mL): 200  Mom to postpartum.  Baby boy Caleb to Couplet care / Skin to Skin.  Alexis White,SNM 3:05 AM      Attestation:  I confirm that I have verified the information documented in the  SNM s note and that I have also personally  closely supervised  the physical exam and all medical decision making activities.   I was gloved and present for entire delivery SVD without incident except for precipitous delivery attended by RN as we entered room No difficulty with shoulders Lacerations as listed above Repair of same supervised by me  Aviva Signs, CNM

## 2021-02-03 LAB — OB RESULTS CONSOLE GBS: GBS: NEGATIVE

## 2021-02-03 LAB — OB RESULTS CONSOLE GC/CHLAMYDIA
Chlamydia: NEGATIVE
Gonorrhea: NEGATIVE

## 2021-02-19 ENCOUNTER — Other Ambulatory Visit: Payer: Self-pay

## 2021-02-19 ENCOUNTER — Inpatient Hospital Stay (HOSPITAL_COMMUNITY)
Admission: AD | Admit: 2021-02-19 | Discharge: 2021-02-21 | DRG: 807 | Disposition: A | Discharge status: Home / Self Care | Payer: Medicaid Other | Attending: Obstetrics and Gynecology | Admitting: Obstetrics and Gynecology

## 2021-02-19 ENCOUNTER — Encounter (HOSPITAL_COMMUNITY): Payer: Self-pay | Admitting: Obstetrics and Gynecology

## 2021-02-19 DIAGNOSIS — Z20822 Contact with and (suspected) exposure to covid-19: Secondary | ICD-10-CM | POA: Diagnosis present

## 2021-02-19 DIAGNOSIS — O26893 Other specified pregnancy related conditions, third trimester: Secondary | ICD-10-CM | POA: Diagnosis present

## 2021-02-19 DIAGNOSIS — O99214 Obesity complicating childbirth: Secondary | ICD-10-CM | POA: Diagnosis present

## 2021-02-19 DIAGNOSIS — Z3A39 39 weeks gestation of pregnancy: Secondary | ICD-10-CM

## 2021-02-19 LAB — TYPE AND SCREEN
ABO/RH(D): O POS
Antibody Screen: NEGATIVE

## 2021-02-19 LAB — CBC
HCT: 37.7 % (ref 36.0–46.0)
Hemoglobin: 11.9 g/dL — ABNORMAL LOW (ref 12.0–15.0)
MCH: 29.2 pg (ref 26.0–34.0)
MCHC: 31.6 g/dL (ref 30.0–36.0)
MCV: 92.4 fL (ref 80.0–100.0)
Platelets: 231 10*3/uL (ref 150–400)
RBC: 4.08 MIL/uL (ref 3.87–5.11)
RDW: 15.1 % (ref 11.5–15.5)
WBC: 11.1 10*3/uL — ABNORMAL HIGH (ref 4.0–10.5)
nRBC: 0 % (ref 0.0–0.2)

## 2021-02-19 LAB — RESP PANEL BY RT-PCR (FLU A&B, COVID) ARPGX2
Influenza A by PCR: NEGATIVE
Influenza B by PCR: NEGATIVE
SARS Coronavirus 2 by RT PCR: NEGATIVE

## 2021-02-19 MED ORDER — SOD CITRATE-CITRIC ACID 500-334 MG/5ML PO SOLN: 30.0000 mL | ORAL | Status: DC | PRN

## 2021-02-19 MED ORDER — OXYCODONE-ACETAMINOPHEN 5-325 MG PO TABS: 1.0000 | ORAL_TABLET | ORAL | Status: DC | PRN

## 2021-02-19 MED ORDER — OXYCODONE-ACETAMINOPHEN 5-325 MG PO TABS: 2.0000 | ORAL_TABLET | ORAL | Status: DC | PRN

## 2021-02-19 MED ORDER — LACTATED RINGERS IV SOLN: 500.0000 mL | INTRAVENOUS | Status: DC | PRN

## 2021-02-19 MED ORDER — OXYTOCIN-SODIUM CHLORIDE 30-0.9 UT/500ML-% IV SOLN
2.5000 [IU]/h | INTRAVENOUS | Status: DC
  Administered 2021-02-20: 2.5 [IU]/h via INTRAVENOUS
  Filled 2021-02-19: qty 500

## 2021-02-19 MED ORDER — ONDANSETRON HCL 4 MG/2ML IJ SOLN: 4.0000 mg | Freq: Four times a day (QID) | INTRAMUSCULAR | Status: DC | PRN

## 2021-02-19 MED ORDER — LACTATED RINGERS IV SOLN: INTRAVENOUS | Status: DC

## 2021-02-19 MED ORDER — LIDOCAINE HCL (PF) 1 % IJ SOLN
30.0000 mL | INTRAMUSCULAR | Status: AC | PRN
  Administered 2021-02-20: 30 mL via SUBCUTANEOUS
  Filled 2021-02-19: qty 30

## 2021-02-19 MED ORDER — FLEET ENEMA 7-19 GM/118ML RE ENEM: 1.0000 | ENEMA | Freq: Every day | RECTAL | Status: DC | PRN

## 2021-02-19 MED ORDER — ACETAMINOPHEN 325 MG PO TABS
650.0000 mg | ORAL_TABLET | ORAL | Status: DC | PRN
  Administered 2021-02-20: 650 mg via ORAL
  Filled 2021-02-19: qty 2

## 2021-02-19 MED ORDER — FENTANYL CITRATE (PF) 100 MCG/2ML IJ SOLN
100.0000 ug | INTRAMUSCULAR | Status: DC | PRN
  Administered 2021-02-19 (×2): 100 ug via INTRAVENOUS
  Filled 2021-02-19 (×3): qty 2

## 2021-02-19 MED ORDER — OXYTOCIN BOLUS FROM INFUSION
333.0000 mL | Freq: Once | INTRAVENOUS | Status: AC
  Administered 2021-02-20: 333 mL via INTRAVENOUS

## 2021-02-19 NOTE — Progress Notes (Addendum)
S: Patient uncomfortable with contractions and wanting IV pain medication. Significant other at bedside.   O: Vitals:   02/19/21 1904 02/19/21 1956 02/19/21 2000 02/19/21 2003  BP: 123/76 116/83  118/84  Pulse: 92 83  79  Resp:    18  Temp:   97.8 F (36.6 C)   TempSrc:   Oral   SpO2:      Weight:      Height:        FHT:  FHR: 140 bpm, variability: moderate,  accelerations:  Present,  decelerations:  Absent UC:   irregular, every 4-6 minutes SVE:   Dilation: 4.5 Effacement (%): 80 Station: -2 Exam by:: [employer] S-CNM  A / P: 31 y.o. [id number] [redacted]w[redacted]d Spontaneous labor,minimal cervical change. Consider Pitocin vs AROM if patient agrees with the plan. -GBS negative -Discussed Pit vs AROM  -Fetal Wellbeing:  Category I -Pain Control:  IV pain meds -Anticipated MOD:  NSVD -Breast/bottle feed -Nexplanon outpatient  Christiane Ha, SNM 02/19/2021, 8:36 PM  Attestation:  I confirm that I have verified the information documented in the students note and that I have also personally reperformed the physical exam and all medical decision making activities.  Attestation: Seabron Spates, CNM

## 2021-02-19 NOTE — Progress Notes (Addendum)
S: Patient feel like contractions still getting more intense and still requesting pain medication for pain. Electronic interpretor Tippie utilized due to language barrier. Patient has no concerns/ questions at this time.    O: Vitals:   02/19/21 2003 02/19/21 2101 02/19/21 2201 02/19/21 2300  BP: 118/84 112/82 129/80 116/79  Pulse: 79 80 83 93  Resp: 18 16 16 16   Temp:      TempSrc:      SpO2:      Weight:      Height:        FHT:  FHR: 135 bpm, variability: moderate,  accelerations:  Present,  decelerations:  Absent UC:   regular, every 1-3 minutes SVE:   Dilation: 5.5 Effacement (%): 80 Station: -2 Exam by:: A. Mikenzie Mccannon, SNM  A / P: 31 y.o. [id number] [redacted]w[redacted]d Spontaneous labor, progressing normally -Expectant management  -GBS negative  -Fetal Wellbeing:  Category I -Pain Control:  IV pain meds -Anticipated MOD:  NSVD  Christiane Ha, SNM 02/19/2021, 11:14 PM

## 2021-02-19 NOTE — MAU Note (Signed)
Pt reports ctx's that started at 1000.   Denies LOF.   Denies vaginal bleeding   Reports +FM
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
## 2021-02-20 ENCOUNTER — Encounter (HOSPITAL_COMMUNITY): Payer: Self-pay | Admitting: Family Medicine

## 2021-02-20 DIAGNOSIS — Z3A39 39 weeks gestation of pregnancy: Secondary | ICD-10-CM

## 2021-02-20 LAB — RPR: RPR Ser Ql: NONREACTIVE

## 2021-02-20 MED ORDER — IBUPROFEN 600 MG PO TABS
600.0000 mg | ORAL_TABLET | Freq: Four times a day (QID) | ORAL | Status: DC
  Administered 2021-02-20 – 2021-02-21 (×6): 600 mg via ORAL
  Filled 2021-02-20 (×6): qty 1

## 2021-02-20 MED ORDER — EPHEDRINE 5 MG/ML INJ: 10.0000 mg | INTRAVENOUS | Status: DC | PRN

## 2021-02-20 MED ORDER — SENNOSIDES-DOCUSATE SODIUM 8.6-50 MG PO TABS
2.0000 | ORAL_TABLET | Freq: Every day | ORAL | Status: DC
  Administered 2021-02-21: 2 via ORAL
  Filled 2021-02-20: qty 2

## 2021-02-20 MED ORDER — PRENATAL MULTIVITAMIN CH
1.0000 | ORAL_TABLET | Freq: Every day | ORAL | Status: DC
  Administered 2021-02-20 – 2021-02-21 (×2): 1 via ORAL
  Filled 2021-02-20 (×2): qty 1

## 2021-02-20 MED ORDER — PHENYLEPHRINE 40 MCG/ML (10ML) SYRINGE FOR IV PUSH (FOR BLOOD PRESSURE SUPPORT): 80.0000 ug | PREFILLED_SYRINGE | INTRAVENOUS | Status: DC | PRN

## 2021-02-20 MED ORDER — SIMETHICONE 80 MG PO CHEW: 80.0000 mg | CHEWABLE_TABLET | ORAL | Status: DC | PRN

## 2021-02-20 MED ORDER — DIPHENHYDRAMINE HCL 25 MG PO CAPS: 25.0000 mg | ORAL_CAPSULE | Freq: Four times a day (QID) | ORAL | Status: DC | PRN

## 2021-02-20 MED ORDER — OXYCODONE HCL 5 MG PO TABS: 5.0000 mg | ORAL_TABLET | ORAL | Status: DC | PRN

## 2021-02-20 MED ORDER — WITCH HAZEL-GLYCERIN EX PADS: 1.0000 "application " | MEDICATED_PAD | CUTANEOUS | Status: DC | PRN

## 2021-02-20 MED ORDER — DIPHENHYDRAMINE HCL 50 MG/ML IJ SOLN: 12.5000 mg | INTRAMUSCULAR | Status: DC | PRN

## 2021-02-20 MED ORDER — ZOLPIDEM TARTRATE 5 MG PO TABS: 5.0000 mg | ORAL_TABLET | Freq: Every evening | ORAL | Status: DC | PRN

## 2021-02-20 MED ORDER — ACETAMINOPHEN 325 MG PO TABS: 650.0000 mg | ORAL_TABLET | ORAL | Status: DC | PRN

## 2021-02-20 MED ORDER — ONDANSETRON HCL 4 MG PO TABS: 4.0000 mg | ORAL_TABLET | ORAL | Status: DC | PRN

## 2021-02-20 MED ORDER — FENTANYL-BUPIVACAINE-NACL 0.5-0.125-0.9 MG/250ML-% EP SOLN: 12.0000 mL/h | EPIDURAL | Status: DC | PRN

## 2021-02-20 MED ORDER — FENTANYL CITRATE (PF) 100 MCG/2ML IJ SOLN
50.0000 ug | Freq: Once | INTRAMUSCULAR | Status: AC
  Administered 2021-02-20: 50 ug via INTRAVENOUS

## 2021-02-20 MED ORDER — DIBUCAINE (PERIANAL) 1 % EX OINT: 1.0000 "application " | TOPICAL_OINTMENT | CUTANEOUS | Status: DC | PRN

## 2021-02-20 MED ORDER — ONDANSETRON HCL 4 MG/2ML IJ SOLN: 4.0000 mg | INTRAMUSCULAR | Status: DC | PRN

## 2021-02-20 MED ORDER — LACTATED RINGERS IV SOLN: 500.0000 mL | Freq: Once | INTRAVENOUS | Status: DC

## 2021-02-20 MED ORDER — TETANUS-DIPHTH-ACELL PERTUSSIS 5-2.5-18.5 LF-MCG/0.5 IM SUSY: 0.5000 mL | PREFILLED_SYRINGE | Freq: Once | INTRAMUSCULAR | Status: DC

## 2021-02-20 MED ORDER — COCONUT OIL OIL: 1.0000 "application " | TOPICAL_OIL | Status: DC | PRN

## 2021-02-20 MED ORDER — BENZOCAINE-MENTHOL 20-0.5 % EX AERO: 1.0000 "application " | INHALATION_SPRAY | CUTANEOUS | Status: DC | PRN

## 2021-02-20 MED ORDER — FENTANYL CITRATE (PF) 100 MCG/2ML IJ SOLN
50.0000 ug | INTRAMUSCULAR | Status: DC | PRN
  Administered 2021-02-20: 50 ug via INTRAVENOUS
  Filled 2021-02-20: qty 2

## 2021-02-20 NOTE — Lactation Note (Signed)
This note was copied from a baby's chart. Lactation Consultation Note  Patient Name: Cheryl Nelson [id number] Date: 02/20/2021 - mom exp BF  Reason for consult: Initial assessment;Term;Breastfeeding assistance;Other (Comment) [facility] Spanish interpreter busy w/ another pt. LC used the On line [name] [id number] . baby spitty, used the bulb syringe x 1, reviewed burping and bulb syringe. Baby awake enough to feed STS and fed 8 mins with swallows.) Age:11 hours Latch score 7  LC reviewed BF basics ( its been 13 years ) worked on depth, positioning.  LC reviewed the reasons not to pull the breast tissue away from the baby's mouth.  Football position worked well for mom.  Per mom comfortable. Nipple well rounded when baby released.  LC reviewed importance of STS feedings and in between feedings/ breast feeding goals for 24 hours - feed with feeding cues and 8-12 times a day.  Mom asked about feeding the baby formula.  LC reviewed belly size and since the baby has been spitty to give the baby time to adjust, and work on the breast feeding.     Maternal Data Has patient been taught Hand Expression?: Yes (per mom + breast changes with pregnancy) Does the patient have breastfeeding experience prior to this delivery?: Yes How long did the patient breastfeed?: per mom 1- 1/2 years / ( girl now 73 years old ) per mom + breast changes with pregnancy   Feeding Mother's Current Feeding Choice: Breast Milk and Formula  LATCH Score Latch: Repeated attempts needed to sustain latch, nipple held in mouth throughout feeding, stimulation needed to elicit sucking reflex.  Audible Swallowing: A few with stimulation  Type of Nipple: Everted at rest and after stimulation  Comfort (Breast/Nipple): Soft / non-tender  Hold (Positioning): Assistance needed to correctly position infant at breast and maintain latch.  LATCH Score: 7   Lactation Tools Discussed/Used    Interventions Interventions: Breast  feeding basics reviewed;Assisted with latch;Skin to skin;Breast massage;Hand express;Reverse pressure;Breast compression;Adjust position;Support pillows;Position options;Education;LC Services brochure  Discharge Pump:  (will need a hand pump for D/C) WIC Program: Yes (per mom)  Consult Status Consult Status: Follow-up Date: 02/20/21 Follow-up type: In-patient    Bell Arthur 02/20/2021, 9:20 AM

## 2021-02-20 NOTE — Progress Notes (Signed)
Patient ID: Cheryl Nelson, female   DOB: March 04, 1990, 31 y.o.   MRN: [id number] Still in a lot of pain with contractions Cervix remains thick anterior lip  Vitals:   02/19/21 2201 02/19/21 2300 02/20/21 0001 02/20/21 0101  BP: 129/80 116/79 133/76 116/76  Pulse: 83 93 89 90  Resp: 16 16 20 20   Temp:      TempSrc:      SpO2:      Weight:      Height:       FHR stable with early decels UCs q 57min  Thick anterior lip across top half of head  IUPC inserted 190-200 mVUs q 50min  WIll monitor for progress Suspect either asynclitism vs CPD

## 2021-02-20 NOTE — Discharge Summary (Signed)
Postpartum Discharge Summary      Patient Name: Cheryl Nelson DOB: 1990/08/26 MRN: [id number]  Date of admission: 02/19/2021 Delivery date:02/20/2021  Delivering provider: Seabron Spates  Date of discharge: 02/21/2021  Admitting diagnosis: Normal labor [O80, Z37.9] Intrauterine pregnancy: [redacted]w[redacted]d    Secondary diagnosis:  Principal Problem:   Normal labor Active Problems:   Vaginal delivery  Additional problems: bleeding in second trimester    Discharge diagnosis: Term Pregnancy Delivered                                              Post partum procedures: none Augmentation: N/A Complications: None  Hospital course: Onset of Labor With Vaginal Delivery      31y.o. yo [id number] admitted in Latent Labor on 02/19/2021. Patient had an uncomplicated labor course as follows:  Membrane Rupture Time/Date: 11:30 PM ,02/19/2021   Delivery Method:Vaginal, Spontaneous  Episiotomy: None  Lacerations:  1st degree;Perineal  Patient had an uncomplicated postpartum course.  She is ambulating, tolerating a regular diet, passing flatus, and urinating well. Patient is discharged home in stable condition on 02/21/21 per her request for early d/c as long as the baby can go as well.  Newborn Data: Birth date:02/20/2021  Birth time:2:40 AM  Gender:Female  Living status:Living  Apgars:9 ,9  Weight:4030 g (8lb 14.2oz)  Magnesium Sulfate received: No BMZ received: No Rhophylac:N/A MMR:No T-DaP:Given prenatally Flu: No Transfusion:No  Physical exam  Vitals:   02/20/21 1400 02/20/21 1835 02/20/21 2303 02/21/21 0654  BP: 114/72 105/70 118/77 128/82  Pulse: 88 88 84   Resp: 18 18 18 18   Temp: 98.5 F (36.9 C) 98.3 F (36.8 C) 98.5 F (36.9 C) 98 F (36.7 C)  TempSrc: Oral Oral Oral Oral  SpO2: 100% 96% 100% 96%  Weight:      Height:       General: alert and cooperative Lochia: appropriate Uterine Fundus: firm Incision: N/A DVT Evaluation: No evidence of DVT seen on  physical exam. Labs: Lab Results  Component Value Date   WBC 11.1 (H) 02/19/2021   HGB 11.9 (L) 02/19/2021   HCT 37.7 02/19/2021   MCV 92.4 02/19/2021   PLT 231 02/19/2021   CMP Latest Ref Rng & Units 09/07/2020  Glucose 70 - 99 mg/dL 94  BUN 6 - 20 mg/dL 5(L)  Creatinine 0.44 - 1.00 mg/dL 0.46  Sodium 135 - 145 mmol/L 137  Potassium 3.5 - 5.1 mmol/L 3.8  Chloride 98 - 111 mmol/L 109  CO2 22 - 32 mmol/L 21(L)  Calcium 8.9 - 10.3 mg/dL 8.9  Total Protein 6.5 - 8.1 g/dL 6.2(L)  Total Bilirubin 0.3 - 1.2 mg/dL 0.2(L)  Alkaline Phos 38 - 126 U/L 60  AST 15 - 41 U/L 26  ALT 0 - 44 U/L 28   Edinburgh Score: Edinburgh Postnatal Depression Scale Screening Tool 02/20/2021  I have been able to laugh and see the funny side of things. 0  I have looked forward with enjoyment to things. 0  I have blamed myself unnecessarily when things went wrong. 0  I have been anxious or worried for no good reason. 2  I have felt scared or panicky for no good reason. 2  Things have been getting on top of me. 0  I have been so unhappy that I have had difficulty sleeping.  0  I have felt sad or miserable. 0  I have been so unhappy that I have been crying. 0  The thought of harming myself has occurred to me. 0  Edinburgh Postnatal Depression Scale Total 4      After visit meds:  Allergies as of 02/21/2021   No Known Allergies      Medication List     TAKE these medications    ibuprofen 600 MG tablet Commonly known as: ADVIL Take 1 tablet (600 mg total) by mouth every 6 (six) hours as needed.   prenatal multivitamin Tabs tablet Take 1 tablet by mouth daily at 12 noon.         Discharge home in stable condition Infant Feeding: Breast Infant Disposition:home with mother Discharge instruction: per After Visit Summary and Postpartum booklet. Activity: Advance as tolerated. Pelvic rest for 6 weeks.  Diet: routine diet Anticipated Birth Control: Nexplanon Postpartum Appointment:4  weeks Additional Postpartum F/U:  none Future Appointments:No future appointments. Follow up Visit:      02/21/2021 Myrtis Ser, CNM 9:08 AM

## 2021-02-21 MED ORDER — IBUPROFEN 600 MG PO TABS
600.0000 mg | ORAL_TABLET | Freq: Four times a day (QID) | ORAL | 0 refills | Status: AC | PRN
Start: 2021-02-21 — End: ?

## 2021-03-04 ENCOUNTER — Telehealth (HOSPITAL_COMMUNITY): Payer: Self-pay | Admitting: *Deleted

## 2021-03-04 NOTE — Telephone Encounter (Signed)
Interpreter attempted call, but voicemail not set up yet. No message left.  Duffy Rhody, RN 03-04-2021 at 1:26pm

## 2021-07-14 ENCOUNTER — Ambulatory Visit: Payer: Self-pay | Attending: Family Medicine | Admitting: Family Medicine

## 2021-07-14 ENCOUNTER — Encounter: Payer: Self-pay | Admitting: Family Medicine

## 2021-07-14 VITALS — BP 116/82 | HR 73 | Temp 98.4°F | Ht 63.0 in | Wt 246.0 lb

## 2021-07-14 DIAGNOSIS — Z13228 Encounter for screening for other metabolic disorders: Secondary | ICD-10-CM

## 2021-07-14 DIAGNOSIS — R899 Unspecified abnormal finding in specimens from other organs, systems and tissues: Secondary | ICD-10-CM

## 2021-07-14 NOTE — Progress Notes (Signed)
Was informed of liver enzymes being elevated.
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
## 2021-07-14 NOTE — Patient Instructions (Signed)
Hacer ejercicio para bajar de peso Exercising to [employer] ejercicio de forma regular es importante para todos. Es especialmente importante si tiene sobrepeso. El sobrepeso aumenta el riesgo de tener enfermedad cardaca, accidente cerebrovascular, diabetes, presin arterial alta y varios tipos de cncer. Hacer ejercicio y reducir las caloras que consume puede ayudarle a Sports coach de [place] y a Teacher, English as a foreign language su estado fsico y Music therapist. El ejercicio puede ser de intensidad moderada o vigorosa. Para bajar de peso, la [employer] de las personas debe hacer una determinada cantidad de ejercicio de intensidad moderada o vigorosa cada semana. Cmo puede afectarme el ejercicio? Cuando hace suficiente ejercicio como para quemar ms caloras que las que consume, baja de [place]. Tambin reduce la grasa corporal y aumenta la masa muscular. Cuanto ms msculo tenga, mayor cantidad de Nurse, children's. El ejercicio tambin: Mejora el estado de nimo. Reduce el estrs y las tensiones. Mejora el estado fsico general, la flexibilidad y la resistencia. Aumenta la fuerza sea. Ejercicio de intensidad moderada  El ejercicio de intensidad moderada es cualquier actividad que lo haga moverse lo suficiente como para quemar al menos tres veces ms energa (caloras) que si estuviera sentado. Algunos ejemplos de ejercicio de intensidad moderada son: Caminar una milla (1,6 kilmetros) en 15 minutos. Hacer trabajos de jardinera livianos. Andar en bicicleta a un ritmo fcil de aguantar. La [employer] de las personas debe hacer al menos 150 minutos por semana de ejercicio de intensidad moderada para Theatre manager su Engineer, site. Ejercicio de intensidad vigorosa El ejercicio de intensidad vigorosa es cualquier actividad que lo haga moverse lo suficiente como para quemar al menos seis veces ms caloras que si estuviera sentado. Al hacer ejercicio a esta intensidad, su nivel de esfuerzo debera ser lo suficientemente alto como para no  permitirle Programmer, systems. Algunos ejemplos de ejercicio de intensidad vigorosa son: Optometrist. Practicar un deporte de equipo, como ftbol americano, baloncesto y ftbol. Saltar la cuerda. La [employer] de las personas debe hacer al menos 75 minutos por semana de ejercicio de intensidad vigorosa para Theatre manager su peso corporal. Qu medidas puedo tomar para bajar de peso? La cantidad de ejercicio que necesita realizar para bajar de peso depende de: Su edad. El tipo de ejercicio. Cualquier afeccin de [employer]. Su capacidad fsica general. Pregntele al mdico cunto ejercicio debe realizar y qu tipos de actividades son seguras para usted. Nutricin  [employer] dieta como se lo haya indicado el mdico o especialista en alimentacin y nutricin (nutricionista). Esto puede incluir: Consumir menos caloras. Consumir ms protenas. Consumir menos grasas no saludables. Seguir una dieta que incluya frutas y verduras frescas, cereales integrales, productos lcteos semidescremados y Advertising account planner. Evite los alimentos con grasa, sal y azcar agregadas. Beba gran cantidad de agua mientras hace ejercicio para evitar la deshidratacin o los golpes de Freight forwarder. Actividad Elija una actividad que disfrute y establezca objetivos realistas. El mdico puede ayudarlo a Paediatric nurse un plan de ejercicio que funcione para usted. Haga ejercicio a una intensidad moderada o vigorosa la [employer] de la [place]. La intensidad de la actividad fsica puede variar de Ardelia Mems persona a Theatre manager. Puede saber qu tan intensa una rutina de ejercicios es para usted al Sales promotion account executive atencin a su respiracin y latidos cardacos. La [employer] de las personas notar que su respiracin y latidos cardacos se aceleran al Optometrist ejercicio de mayor intensidad. Haga entrenamiento de [employer] veces por semana, como: Flexiones de [place]. Abdominales. Levantamiento de pesas. Uso de bandas elsticas de  resistencia. Hacer ejercicio en perodos cortos de tiempo puede ser tan til como en perodos largos y estructurados. Si tiene problemas para Music therapist para Materials engineer, intente hacer estas cosas como parte de su rutina diaria: Sherron Monday, estrese y camine cada 30 minutos a lo largo del Futures trader. Vaya a caminar durante su hora de almuerzo. Estacione el auto lejos de su lugar de destino. Si [place] transporte pblico, bjese una parada antes y camine el resto del camino. Pngase de pie y camine cada vez que hable por telfono. Utilice la Optometrist del ascensor o la Art gallery manager. Use ropa cmoda y calzado con buen soporte. No haga ejercicio en exceso que pudiera hacer que se lastime, se sienta mareado o tenga dificultad para respirar. Dnde buscar ms informacin U.S. Department of Health and [employer] (Departamento de Salud y Servicios Humanos de los [place]): ThisPath.fi Centers for Disease Control and Prevention (Centros para Air traffic controller y la Prevencin de Event organiser): [email or web address].br Comunquese con un mdico: Antes de comenzar un nuevo programa de ejercicios. Si tiene preguntas o inquietudes acerca de su peso. Si tiene un problema mdico que Musician. Solicite ayuda de inmediato si: Presenta algo de lo siguiente mientras hace ejercicio: Lesiones. Mareos. Dificultad para respirar o falta de aire que no desaparecen al dejar de hacer ejercicio. Dolor en el pecho. Latidos cardacos rpidos. Estos sntomas pueden representar un problema grave que constituye Radio broadcast assistant. No espere a ver si los sntomas desaparecen. Solicite atencin mdica de inmediato. Comunquese con el servicio de emergencias de su localidad (911 en los [place]). No conduzca por sus propios medios [employer] hospital. Resumen Hacer ejercicio con regularidad es especialmente importante si tiene sobrepeso. El sobrepeso aumenta el riesgo de tener enfermedad cardaca, accidente  cerebrovascular, diabetes, presin arterial alta y varios tipos de cncer. Para perder peso debe quemar ms caloras que las que consume. Reducir la cantidad de caloras que consume, y Radio producer ejercicio de intensidad moderada o vigorosa todas las [place], [place] a Publishing copy de [place]. Esta informacin no tiene Theme park manager el consejo del mdico. Asegrese de hacerle al mdico cualquier pregunta que tenga. Document Revised: 03/31/2020 Document Reviewed: 03/31/2020 Elsevier Patient Education  2023 [employer].

## 2021-07-15 LAB — CBC WITH DIFFERENTIAL/PLATELET
Basophils Absolute: 0.1 10*3/uL (ref 0.0–0.2)
Basos: 1 %
EOS (ABSOLUTE): 0.2 10*3/uL (ref 0.0–0.4)
Eos: 2 %
Hematocrit: 42.9 % (ref 34.0–46.6)
Hemoglobin: 13.9 g/dL (ref 11.1–15.9)
Immature Grans (Abs): 0 10*3/uL (ref 0.0–0.1)
Immature Granulocytes: 0 %
Lymphocytes Absolute: 3 10*3/uL (ref 0.7–3.1)
Lymphs: 38 %
MCH: 28.7 pg (ref 26.6–33.0)
MCHC: 32.4 g/dL (ref 31.5–35.7)
MCV: 89 fL (ref 79–97)
Monocytes Absolute: 0.5 10*3/uL (ref 0.1–0.9)
Monocytes: 6 %
Neutrophils Absolute: 4.2 10*3/uL (ref 1.4–7.0)
Neutrophils: 53 %
Platelets: 280 10*3/uL (ref 150–450)
RBC: 4.84 x10E6/uL (ref 3.77–5.28)
RDW: 12.8 % (ref 11.7–15.4)
WBC: 7.9 10*3/uL (ref 3.4–10.8)

## 2021-07-15 LAB — CMP14+EGFR
ALT: 38 IU/L — ABNORMAL HIGH (ref 0–32)
AST: 29 IU/L (ref 0–40)
Albumin/Globulin Ratio: 1.7 (ref 1.2–2.2)
Albumin: 4.5 g/dL (ref 3.8–4.8)
Alkaline Phosphatase: 98 IU/L (ref 44–121)
BUN/Creatinine Ratio: 16 (ref 9–23)
BUN: 9 mg/dL (ref 6–20)
Bilirubin Total: 0.4 mg/dL (ref 0.0–1.2)
CO2: 18 mmol/L — ABNORMAL LOW (ref 20–29)
Calcium: 9.4 mg/dL (ref 8.7–10.2)
Chloride: 106 mmol/L (ref 96–106)
Creatinine, Ser: 0.56 mg/dL — ABNORMAL LOW (ref 0.57–1.00)
Globulin, Total: 2.7 g/dL (ref 1.5–4.5)
Glucose: 96 mg/dL (ref 70–99)
Potassium: 4.3 mmol/L (ref 3.5–5.2)
Sodium: 141 mmol/L (ref 134–144)
Total Protein: 7.2 g/dL (ref 6.0–8.5)
eGFR: 125 mL/min/{1.73_m2} (ref >59)

## 2021-07-15 LAB — LP+NON-HDL CHOLESTEROL
Cholesterol, Total: 184 mg/dL (ref 100–199)
HDL: 32 mg/dL — ABNORMAL LOW (ref >39)
LDL Chol Calc (NIH): 127 mg/dL — ABNORMAL HIGH (ref 0–99)
Total Non-HDL-Chol (LDL+VLDL): 152 mg/dL — ABNORMAL HIGH (ref 0–129)
Triglycerides: 135 mg/dL (ref 0–149)
VLDL Cholesterol Cal: 25 mg/dL (ref 5–40)

## 2021-07-15 LAB — HEMOGLOBIN A1C
Est. average glucose Bld gHb Est-mCnc: 108 mg/dL
Hgb A1c MFr Bld: 5.4 % (ref 4.8–5.6)
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
